# Patient Record
Sex: Male | Born: 1935 | Race: White | Hispanic: No | Marital: Single
Health system: Southern US, Community
[De-identification: ages and names within clinical notes are randomized; demographics above are authoritative.]

---

## 2015-11-17 DIAGNOSIS — J0100 Acute maxillary sinusitis, unspecified: Secondary | ICD-10-CM | POA: Diagnosis not present

## 2015-12-06 DIAGNOSIS — E162 Hypoglycemia, unspecified: Secondary | ICD-10-CM | POA: Diagnosis not present

## 2015-12-06 DIAGNOSIS — J209 Acute bronchitis, unspecified: Secondary | ICD-10-CM | POA: Diagnosis not present

## 2015-12-06 DIAGNOSIS — I10 Essential (primary) hypertension: Secondary | ICD-10-CM | POA: Diagnosis not present

## 2015-12-16 DIAGNOSIS — E039 Hypothyroidism, unspecified: Secondary | ICD-10-CM | POA: Diagnosis not present

## 2015-12-16 DIAGNOSIS — I10 Essential (primary) hypertension: Secondary | ICD-10-CM | POA: Diagnosis not present

## 2015-12-16 DIAGNOSIS — E785 Hyperlipidemia, unspecified: Secondary | ICD-10-CM | POA: Diagnosis not present

## 2015-12-16 DIAGNOSIS — R531 Weakness: Secondary | ICD-10-CM | POA: Diagnosis not present

## 2015-12-16 DIAGNOSIS — E119 Type 2 diabetes mellitus without complications: Secondary | ICD-10-CM | POA: Diagnosis not present

## 2015-12-16 DIAGNOSIS — M1990 Unspecified osteoarthritis, unspecified site: Secondary | ICD-10-CM | POA: Diagnosis not present

## 2015-12-16 DIAGNOSIS — R5383 Other fatigue: Secondary | ICD-10-CM | POA: Diagnosis not present

## 2015-12-16 DIAGNOSIS — N400 Enlarged prostate without lower urinary tract symptoms: Secondary | ICD-10-CM | POA: Diagnosis not present

## 2016-04-18 DIAGNOSIS — S4992XA Unspecified injury of left shoulder and upper arm, initial encounter: Secondary | ICD-10-CM | POA: Diagnosis not present

## 2016-04-18 DIAGNOSIS — E039 Hypothyroidism, unspecified: Secondary | ICD-10-CM | POA: Diagnosis not present

## 2016-04-18 DIAGNOSIS — I10 Essential (primary) hypertension: Secondary | ICD-10-CM | POA: Diagnosis not present

## 2016-05-22 DIAGNOSIS — C44311 Basal cell carcinoma of skin of nose: Secondary | ICD-10-CM | POA: Diagnosis not present

## 2016-06-05 DIAGNOSIS — M25512 Pain in left shoulder: Secondary | ICD-10-CM | POA: Diagnosis not present

## 2016-06-05 DIAGNOSIS — S4992XD Unspecified injury of left shoulder and upper arm, subsequent encounter: Secondary | ICD-10-CM | POA: Diagnosis not present

## 2016-06-22 DIAGNOSIS — M21371 Foot drop, right foot: Secondary | ICD-10-CM | POA: Diagnosis not present

## 2016-06-22 DIAGNOSIS — M21372 Foot drop, left foot: Secondary | ICD-10-CM | POA: Diagnosis not present

## 2016-07-13 DIAGNOSIS — R2689 Other abnormalities of gait and mobility: Secondary | ICD-10-CM | POA: Diagnosis not present

## 2016-07-13 DIAGNOSIS — M21371 Foot drop, right foot: Secondary | ICD-10-CM | POA: Diagnosis not present

## 2016-07-13 DIAGNOSIS — M21372 Foot drop, left foot: Secondary | ICD-10-CM | POA: Diagnosis not present

## 2016-09-19 DIAGNOSIS — M21371 Foot drop, right foot: Secondary | ICD-10-CM | POA: Diagnosis not present

## 2016-09-19 DIAGNOSIS — R2689 Other abnormalities of gait and mobility: Secondary | ICD-10-CM | POA: Diagnosis not present

## 2016-09-19 DIAGNOSIS — M21372 Foot drop, left foot: Secondary | ICD-10-CM | POA: Diagnosis not present

## 2016-10-04 DIAGNOSIS — R296 Repeated falls: Secondary | ICD-10-CM | POA: Diagnosis not present

## 2016-10-04 DIAGNOSIS — R2689 Other abnormalities of gait and mobility: Secondary | ICD-10-CM | POA: Diagnosis not present

## 2016-10-04 DIAGNOSIS — M21372 Foot drop, left foot: Secondary | ICD-10-CM | POA: Diagnosis not present

## 2016-10-04 DIAGNOSIS — M21371 Foot drop, right foot: Secondary | ICD-10-CM | POA: Diagnosis not present

## 2016-10-19 DIAGNOSIS — I10 Essential (primary) hypertension: Secondary | ICD-10-CM | POA: Diagnosis not present

## 2016-10-19 DIAGNOSIS — E039 Hypothyroidism, unspecified: Secondary | ICD-10-CM | POA: Diagnosis not present

## 2016-10-19 DIAGNOSIS — R739 Hyperglycemia, unspecified: Secondary | ICD-10-CM | POA: Diagnosis not present

## 2016-10-19 DIAGNOSIS — Z131 Encounter for screening for diabetes mellitus: Secondary | ICD-10-CM | POA: Diagnosis not present

## 2016-10-19 DIAGNOSIS — E119 Type 2 diabetes mellitus without complications: Secondary | ICD-10-CM | POA: Diagnosis not present

## 2016-10-19 DIAGNOSIS — E782 Mixed hyperlipidemia: Secondary | ICD-10-CM | POA: Diagnosis not present

## 2016-11-01 DIAGNOSIS — C44329 Squamous cell carcinoma of skin of other parts of face: Secondary | ICD-10-CM | POA: Diagnosis not present

## 2016-11-01 DIAGNOSIS — L570 Actinic keratosis: Secondary | ICD-10-CM | POA: Diagnosis not present

## 2016-11-21 DIAGNOSIS — E119 Type 2 diabetes mellitus without complications: Secondary | ICD-10-CM | POA: Diagnosis not present

## 2016-12-28 DIAGNOSIS — E782 Mixed hyperlipidemia: Secondary | ICD-10-CM | POA: Diagnosis not present

## 2017-01-17 DIAGNOSIS — N183 Chronic kidney disease, stage 3 (moderate): Secondary | ICD-10-CM | POA: Diagnosis not present

## 2017-01-17 DIAGNOSIS — J0120 Acute ethmoidal sinusitis, unspecified: Secondary | ICD-10-CM | POA: Diagnosis not present

## 2017-01-17 DIAGNOSIS — E119 Type 2 diabetes mellitus without complications: Secondary | ICD-10-CM | POA: Diagnosis not present

## 2017-01-17 DIAGNOSIS — E039 Hypothyroidism, unspecified: Secondary | ICD-10-CM | POA: Diagnosis not present

## 2017-01-18 DIAGNOSIS — M21372 Foot drop, left foot: Secondary | ICD-10-CM | POA: Diagnosis not present

## 2017-01-18 DIAGNOSIS — S91311A Laceration without foreign body, right foot, initial encounter: Secondary | ICD-10-CM | POA: Diagnosis not present

## 2017-01-18 DIAGNOSIS — E119 Type 2 diabetes mellitus without complications: Secondary | ICD-10-CM | POA: Diagnosis not present

## 2017-01-18 DIAGNOSIS — M21371 Foot drop, right foot: Secondary | ICD-10-CM | POA: Diagnosis not present

## 2017-01-22 DIAGNOSIS — E039 Hypothyroidism, unspecified: Secondary | ICD-10-CM | POA: Diagnosis not present

## 2017-01-22 DIAGNOSIS — E119 Type 2 diabetes mellitus without complications: Secondary | ICD-10-CM | POA: Diagnosis not present

## 2017-01-24 DIAGNOSIS — L300 Nummular dermatitis: Secondary | ICD-10-CM | POA: Diagnosis not present

## 2017-01-24 DIAGNOSIS — R233 Spontaneous ecchymoses: Secondary | ICD-10-CM | POA: Diagnosis not present

## 2017-02-21 DIAGNOSIS — R748 Abnormal levels of other serum enzymes: Secondary | ICD-10-CM | POA: Diagnosis not present

## 2017-03-12 DIAGNOSIS — R310 Gross hematuria: Secondary | ICD-10-CM | POA: Diagnosis not present

## 2017-03-12 DIAGNOSIS — E782 Mixed hyperlipidemia: Secondary | ICD-10-CM | POA: Diagnosis not present

## 2017-03-12 DIAGNOSIS — M791 Myalgia: Secondary | ICD-10-CM | POA: Diagnosis not present

## 2017-03-13 DIAGNOSIS — R945 Abnormal results of liver function studies: Secondary | ICD-10-CM | POA: Diagnosis not present

## 2017-03-13 DIAGNOSIS — K838 Other specified diseases of biliary tract: Secondary | ICD-10-CM | POA: Diagnosis not present

## 2017-03-13 DIAGNOSIS — R748 Abnormal levels of other serum enzymes: Secondary | ICD-10-CM | POA: Diagnosis not present

## 2017-03-17 DIAGNOSIS — Z7982 Long term (current) use of aspirin: Secondary | ICD-10-CM | POA: Diagnosis not present

## 2017-03-17 DIAGNOSIS — I129 Hypertensive chronic kidney disease with stage 1 through stage 4 chronic kidney disease, or unspecified chronic kidney disease: Secondary | ICD-10-CM | POA: Diagnosis not present

## 2017-03-17 DIAGNOSIS — R195 Other fecal abnormalities: Secondary | ICD-10-CM | POA: Diagnosis not present

## 2017-03-17 DIAGNOSIS — R17 Unspecified jaundice: Secondary | ICD-10-CM | POA: Diagnosis not present

## 2017-03-17 DIAGNOSIS — R7989 Other specified abnormal findings of blood chemistry: Secondary | ICD-10-CM | POA: Diagnosis not present

## 2017-03-17 DIAGNOSIS — K820 Obstruction of gallbladder: Secondary | ICD-10-CM | POA: Diagnosis not present

## 2017-03-17 DIAGNOSIS — E860 Dehydration: Secondary | ICD-10-CM | POA: Diagnosis not present

## 2017-03-17 DIAGNOSIS — K922 Gastrointestinal hemorrhage, unspecified: Secondary | ICD-10-CM | POA: Diagnosis not present

## 2017-03-17 DIAGNOSIS — Z87891 Personal history of nicotine dependence: Secondary | ICD-10-CM | POA: Diagnosis not present

## 2017-03-17 DIAGNOSIS — R531 Weakness: Secondary | ICD-10-CM | POA: Diagnosis not present

## 2017-03-17 DIAGNOSIS — H9190 Unspecified hearing loss, unspecified ear: Secondary | ICD-10-CM | POA: Diagnosis not present

## 2017-03-17 DIAGNOSIS — R634 Abnormal weight loss: Secondary | ICD-10-CM | POA: Diagnosis not present

## 2017-03-17 DIAGNOSIS — E875 Hyperkalemia: Secondary | ICD-10-CM | POA: Diagnosis not present

## 2017-03-17 DIAGNOSIS — D500 Iron deficiency anemia secondary to blood loss (chronic): Secondary | ICD-10-CM | POA: Diagnosis not present

## 2017-03-17 DIAGNOSIS — K8689 Other specified diseases of pancreas: Secondary | ICD-10-CM | POA: Diagnosis not present

## 2017-03-17 DIAGNOSIS — I10 Essential (primary) hypertension: Secondary | ICD-10-CM | POA: Diagnosis not present

## 2017-03-17 DIAGNOSIS — E039 Hypothyroidism, unspecified: Secondary | ICD-10-CM | POA: Diagnosis not present

## 2017-03-17 DIAGNOSIS — N183 Chronic kidney disease, stage 3 (moderate): Secondary | ICD-10-CM | POA: Diagnosis not present

## 2017-03-17 DIAGNOSIS — K921 Melena: Secondary | ICD-10-CM | POA: Diagnosis not present

## 2017-03-17 DIAGNOSIS — I429 Cardiomyopathy, unspecified: Secondary | ICD-10-CM | POA: Diagnosis not present

## 2017-03-17 DIAGNOSIS — N179 Acute kidney failure, unspecified: Secondary | ICD-10-CM | POA: Diagnosis not present

## 2017-03-17 DIAGNOSIS — K831 Obstruction of bile duct: Secondary | ICD-10-CM | POA: Diagnosis not present

## 2017-03-17 DIAGNOSIS — Z79899 Other long term (current) drug therapy: Secondary | ICD-10-CM | POA: Diagnosis not present

## 2017-03-17 DIAGNOSIS — R945 Abnormal results of liver function studies: Secondary | ICD-10-CM | POA: Diagnosis not present

## 2017-03-18 DIAGNOSIS — K831 Obstruction of bile duct: Secondary | ICD-10-CM | POA: Diagnosis not present

## 2017-03-18 DIAGNOSIS — R933 Abnormal findings on diagnostic imaging of other parts of digestive tract: Secondary | ICD-10-CM | POA: Diagnosis not present

## 2017-03-18 DIAGNOSIS — R945 Abnormal results of liver function studies: Secondary | ICD-10-CM | POA: Diagnosis not present

## 2017-03-18 DIAGNOSIS — K869 Disease of pancreas, unspecified: Secondary | ICD-10-CM | POA: Diagnosis not present

## 2017-03-19 DIAGNOSIS — K869 Disease of pancreas, unspecified: Secondary | ICD-10-CM | POA: Diagnosis not present

## 2017-03-19 DIAGNOSIS — K831 Obstruction of bile duct: Secondary | ICD-10-CM | POA: Diagnosis not present

## 2017-03-20 DIAGNOSIS — K831 Obstruction of bile duct: Secondary | ICD-10-CM | POA: Diagnosis not present

## 2017-03-20 DIAGNOSIS — D500 Iron deficiency anemia secondary to blood loss (chronic): Secondary | ICD-10-CM | POA: Diagnosis not present

## 2017-03-20 DIAGNOSIS — K921 Melena: Secondary | ICD-10-CM | POA: Diagnosis not present

## 2017-03-20 DIAGNOSIS — R933 Abnormal findings on diagnostic imaging of other parts of digestive tract: Secondary | ICD-10-CM | POA: Diagnosis not present

## 2017-03-20 DIAGNOSIS — K869 Disease of pancreas, unspecified: Secondary | ICD-10-CM | POA: Diagnosis not present

## 2017-03-20 DIAGNOSIS — R945 Abnormal results of liver function studies: Secondary | ICD-10-CM | POA: Diagnosis not present

## 2017-03-20 DIAGNOSIS — R195 Other fecal abnormalities: Secondary | ICD-10-CM | POA: Diagnosis not present

## 2017-03-21 DIAGNOSIS — R740 Nonspecific elevation of levels of transaminase and lactic acid dehydrogenase [LDH]: Secondary | ICD-10-CM | POA: Diagnosis not present

## 2017-03-21 DIAGNOSIS — K922 Gastrointestinal hemorrhage, unspecified: Secondary | ICD-10-CM | POA: Diagnosis not present

## 2017-03-21 DIAGNOSIS — I10 Essential (primary) hypertension: Secondary | ICD-10-CM | POA: Diagnosis not present

## 2017-03-21 DIAGNOSIS — R262 Difficulty in walking, not elsewhere classified: Secondary | ICD-10-CM | POA: Diagnosis not present

## 2017-03-21 DIAGNOSIS — B252 Cytomegaloviral pancreatitis: Secondary | ICD-10-CM | POA: Diagnosis not present

## 2017-03-21 DIAGNOSIS — E038 Other specified hypothyroidism: Secondary | ICD-10-CM | POA: Diagnosis not present

## 2017-03-21 DIAGNOSIS — R489 Unspecified symbolic dysfunctions: Secondary | ICD-10-CM | POA: Diagnosis not present

## 2017-03-21 DIAGNOSIS — K869 Disease of pancreas, unspecified: Secondary | ICD-10-CM | POA: Diagnosis not present

## 2017-03-21 DIAGNOSIS — M6281 Muscle weakness (generalized): Secondary | ICD-10-CM | POA: Diagnosis not present

## 2017-03-21 DIAGNOSIS — Z9181 History of falling: Secondary | ICD-10-CM | POA: Diagnosis not present

## 2017-03-21 DIAGNOSIS — R1084 Generalized abdominal pain: Secondary | ICD-10-CM | POA: Diagnosis not present

## 2017-03-21 DIAGNOSIS — R17 Unspecified jaundice: Secondary | ICD-10-CM | POA: Diagnosis not present

## 2017-03-21 DIAGNOSIS — I129 Hypertensive chronic kidney disease with stage 1 through stage 4 chronic kidney disease, or unspecified chronic kidney disease: Secondary | ICD-10-CM | POA: Diagnosis not present

## 2017-03-22 DIAGNOSIS — E039 Hypothyroidism, unspecified: Secondary | ICD-10-CM | POA: Diagnosis not present

## 2017-03-22 DIAGNOSIS — K869 Disease of pancreas, unspecified: Secondary | ICD-10-CM | POA: Diagnosis not present

## 2017-03-22 DIAGNOSIS — I428 Other cardiomyopathies: Secondary | ICD-10-CM | POA: Diagnosis not present

## 2017-03-22 DIAGNOSIS — I10 Essential (primary) hypertension: Secondary | ICD-10-CM | POA: Diagnosis not present

## 2017-03-24 DIAGNOSIS — K921 Melena: Secondary | ICD-10-CM | POA: Diagnosis not present

## 2017-03-24 DIAGNOSIS — I428 Other cardiomyopathies: Secondary | ICD-10-CM | POA: Diagnosis not present

## 2017-03-24 DIAGNOSIS — E039 Hypothyroidism, unspecified: Secondary | ICD-10-CM | POA: Diagnosis not present

## 2017-03-24 DIAGNOSIS — I10 Essential (primary) hypertension: Secondary | ICD-10-CM | POA: Diagnosis not present

## 2017-03-28 ENCOUNTER — Other Ambulatory Visit: Payer: Self-pay | Admitting: *Deleted

## 2017-03-28 NOTE — Patient Outreach (Signed)
[facility]) Care Management  03/28/2017  Chad Collins 17-Jul-1933 [id number]   Met with Chad Quam, LPN, Case manager at facility. She reports patient has a new diagnosis of pancreatic cancer. She reports patient and family are in denial of diagnosis. She reports that patient wanted to leave AMA yesterday but she was able to speak with patient and family regarding discharge and they have agreed to stay until 03/29/17. She will set up home care. RNCM discussed home care agencies with contracts with [facility]. Explained that patient is eligible for [facility] care management services.   Met with patient wife at bedside of facility. She states patient going home tomorrow. She states he has some tests to find out if he has cancer. She states that if he does he may come back to the facility for care.  RNCM reviewed [facility] community care management program. Gave patient wife a brochure and explained for her to contact RNCM or [facility] care management office for future needs. She agrees.   Plan to sign . Patient will get EMMI discharge calls. Family has [facility] and RNCM contact for future needs.  Chad Collins. Chad Purser, RN, BSN, [place] [phone]) Business Cell  [phone]) Toll Free Office

## 2017-03-29 DIAGNOSIS — E039 Hypothyroidism, unspecified: Secondary | ICD-10-CM | POA: Diagnosis not present

## 2017-03-29 DIAGNOSIS — I428 Other cardiomyopathies: Secondary | ICD-10-CM | POA: Diagnosis not present

## 2017-03-29 DIAGNOSIS — I10 Essential (primary) hypertension: Secondary | ICD-10-CM | POA: Diagnosis not present

## 2017-03-29 DIAGNOSIS — K869 Disease of pancreas, unspecified: Secondary | ICD-10-CM | POA: Diagnosis not present

## 2017-03-30 DIAGNOSIS — C250 Malignant neoplasm of head of pancreas: Secondary | ICD-10-CM | POA: Diagnosis not present

## 2017-03-30 DIAGNOSIS — E119 Type 2 diabetes mellitus without complications: Secondary | ICD-10-CM | POA: Diagnosis not present

## 2017-03-30 DIAGNOSIS — K869 Disease of pancreas, unspecified: Secondary | ICD-10-CM | POA: Diagnosis not present

## 2017-03-30 DIAGNOSIS — R17 Unspecified jaundice: Secondary | ICD-10-CM | POA: Diagnosis not present

## 2017-03-30 DIAGNOSIS — Z7982 Long term (current) use of aspirin: Secondary | ICD-10-CM | POA: Diagnosis not present

## 2017-03-30 DIAGNOSIS — I351 Nonrheumatic aortic (valve) insufficiency: Secondary | ICD-10-CM | POA: Diagnosis not present

## 2017-03-30 DIAGNOSIS — E039 Hypothyroidism, unspecified: Secondary | ICD-10-CM | POA: Diagnosis not present

## 2017-03-30 DIAGNOSIS — K862 Cyst of pancreas: Secondary | ICD-10-CM | POA: Diagnosis not present

## 2017-03-30 DIAGNOSIS — N183 Chronic kidney disease, stage 3 (moderate): Secondary | ICD-10-CM | POA: Diagnosis not present

## 2017-03-30 DIAGNOSIS — K831 Obstruction of bile duct: Secondary | ICD-10-CM | POA: Diagnosis not present

## 2017-03-30 DIAGNOSIS — I129 Hypertensive chronic kidney disease with stage 1 through stage 4 chronic kidney disease, or unspecified chronic kidney disease: Secondary | ICD-10-CM | POA: Diagnosis not present

## 2017-03-31 DIAGNOSIS — I129 Hypertensive chronic kidney disease with stage 1 through stage 4 chronic kidney disease, or unspecified chronic kidney disease: Secondary | ICD-10-CM | POA: Diagnosis not present

## 2017-03-31 DIAGNOSIS — Z9689 Presence of other specified functional implants: Secondary | ICD-10-CM | POA: Diagnosis not present

## 2017-03-31 DIAGNOSIS — N183 Chronic kidney disease, stage 3 (moderate): Secondary | ICD-10-CM | POA: Diagnosis not present

## 2017-03-31 DIAGNOSIS — Z87891 Personal history of nicotine dependence: Secondary | ICD-10-CM | POA: Diagnosis not present

## 2017-03-31 DIAGNOSIS — I429 Cardiomyopathy, unspecified: Secondary | ICD-10-CM | POA: Diagnosis not present

## 2017-03-31 DIAGNOSIS — R32 Unspecified urinary incontinence: Secondary | ICD-10-CM | POA: Diagnosis not present

## 2017-03-31 DIAGNOSIS — Z9181 History of falling: Secondary | ICD-10-CM | POA: Diagnosis not present

## 2017-03-31 DIAGNOSIS — D490 Neoplasm of unspecified behavior of digestive system: Secondary | ICD-10-CM | POA: Diagnosis not present

## 2017-04-05 DIAGNOSIS — R188 Other ascites: Secondary | ICD-10-CM | POA: Diagnosis not present

## 2017-04-10 DIAGNOSIS — K831 Obstruction of bile duct: Secondary | ICD-10-CM | POA: Diagnosis not present

## 2017-04-10 DIAGNOSIS — C250 Malignant neoplasm of head of pancreas: Secondary | ICD-10-CM | POA: Diagnosis not present

## 2017-04-10 DIAGNOSIS — K869 Disease of pancreas, unspecified: Secondary | ICD-10-CM | POA: Diagnosis not present

## 2017-04-12 DIAGNOSIS — C250 Malignant neoplasm of head of pancreas: Secondary | ICD-10-CM | POA: Diagnosis not present

## 2017-04-16 DIAGNOSIS — C250 Malignant neoplasm of head of pancreas: Secondary | ICD-10-CM | POA: Diagnosis not present

## 2017-04-16 DIAGNOSIS — K869 Disease of pancreas, unspecified: Secondary | ICD-10-CM | POA: Diagnosis not present

## 2017-04-26 DIAGNOSIS — E039 Hypothyroidism, unspecified: Secondary | ICD-10-CM | POA: Diagnosis not present

## 2017-04-26 DIAGNOSIS — E119 Type 2 diabetes mellitus without complications: Secondary | ICD-10-CM | POA: Diagnosis not present

## 2017-04-26 DIAGNOSIS — I10 Essential (primary) hypertension: Secondary | ICD-10-CM | POA: Diagnosis not present

## 2017-04-26 DIAGNOSIS — E782 Mixed hyperlipidemia: Secondary | ICD-10-CM | POA: Diagnosis not present

## 2017-05-31 DIAGNOSIS — I351 Nonrheumatic aortic (valve) insufficiency: Secondary | ICD-10-CM | POA: Diagnosis not present

## 2017-05-31 DIAGNOSIS — E119 Type 2 diabetes mellitus without complications: Secondary | ICD-10-CM | POA: Diagnosis not present

## 2017-05-31 DIAGNOSIS — K869 Disease of pancreas, unspecified: Secondary | ICD-10-CM | POA: Diagnosis not present

## 2017-05-31 DIAGNOSIS — Z9889 Other specified postprocedural states: Secondary | ICD-10-CM | POA: Diagnosis not present

## 2017-05-31 DIAGNOSIS — E039 Hypothyroidism, unspecified: Secondary | ICD-10-CM | POA: Diagnosis not present

## 2017-05-31 DIAGNOSIS — K831 Obstruction of bile duct: Secondary | ICD-10-CM | POA: Diagnosis not present

## 2017-05-31 DIAGNOSIS — Z87891 Personal history of nicotine dependence: Secondary | ICD-10-CM | POA: Diagnosis not present

## 2017-05-31 DIAGNOSIS — E785 Hyperlipidemia, unspecified: Secondary | ICD-10-CM | POA: Diagnosis not present

## 2017-05-31 DIAGNOSIS — K219 Gastro-esophageal reflux disease without esophagitis: Secondary | ICD-10-CM | POA: Diagnosis not present

## 2017-05-31 DIAGNOSIS — C250 Malignant neoplasm of head of pancreas: Secondary | ICD-10-CM | POA: Diagnosis not present

## 2017-08-30 DIAGNOSIS — G893 Neoplasm related pain (acute) (chronic): Secondary | ICD-10-CM | POA: Diagnosis not present

## 2017-08-30 DIAGNOSIS — I10 Essential (primary) hypertension: Secondary | ICD-10-CM | POA: Diagnosis not present

## 2017-08-30 DIAGNOSIS — C250 Malignant neoplasm of head of pancreas: Secondary | ICD-10-CM | POA: Diagnosis not present

## 2017-09-10 IMAGING — DX DG CHEST 2V
2 series · 2 of 2 positions shown · non-contrast
Comparison: None.

CLINICAL DATA: Left side chest pain.  Bilateral hand numbness.

EXAM:
CHEST  2 VIEW

[chest pa]
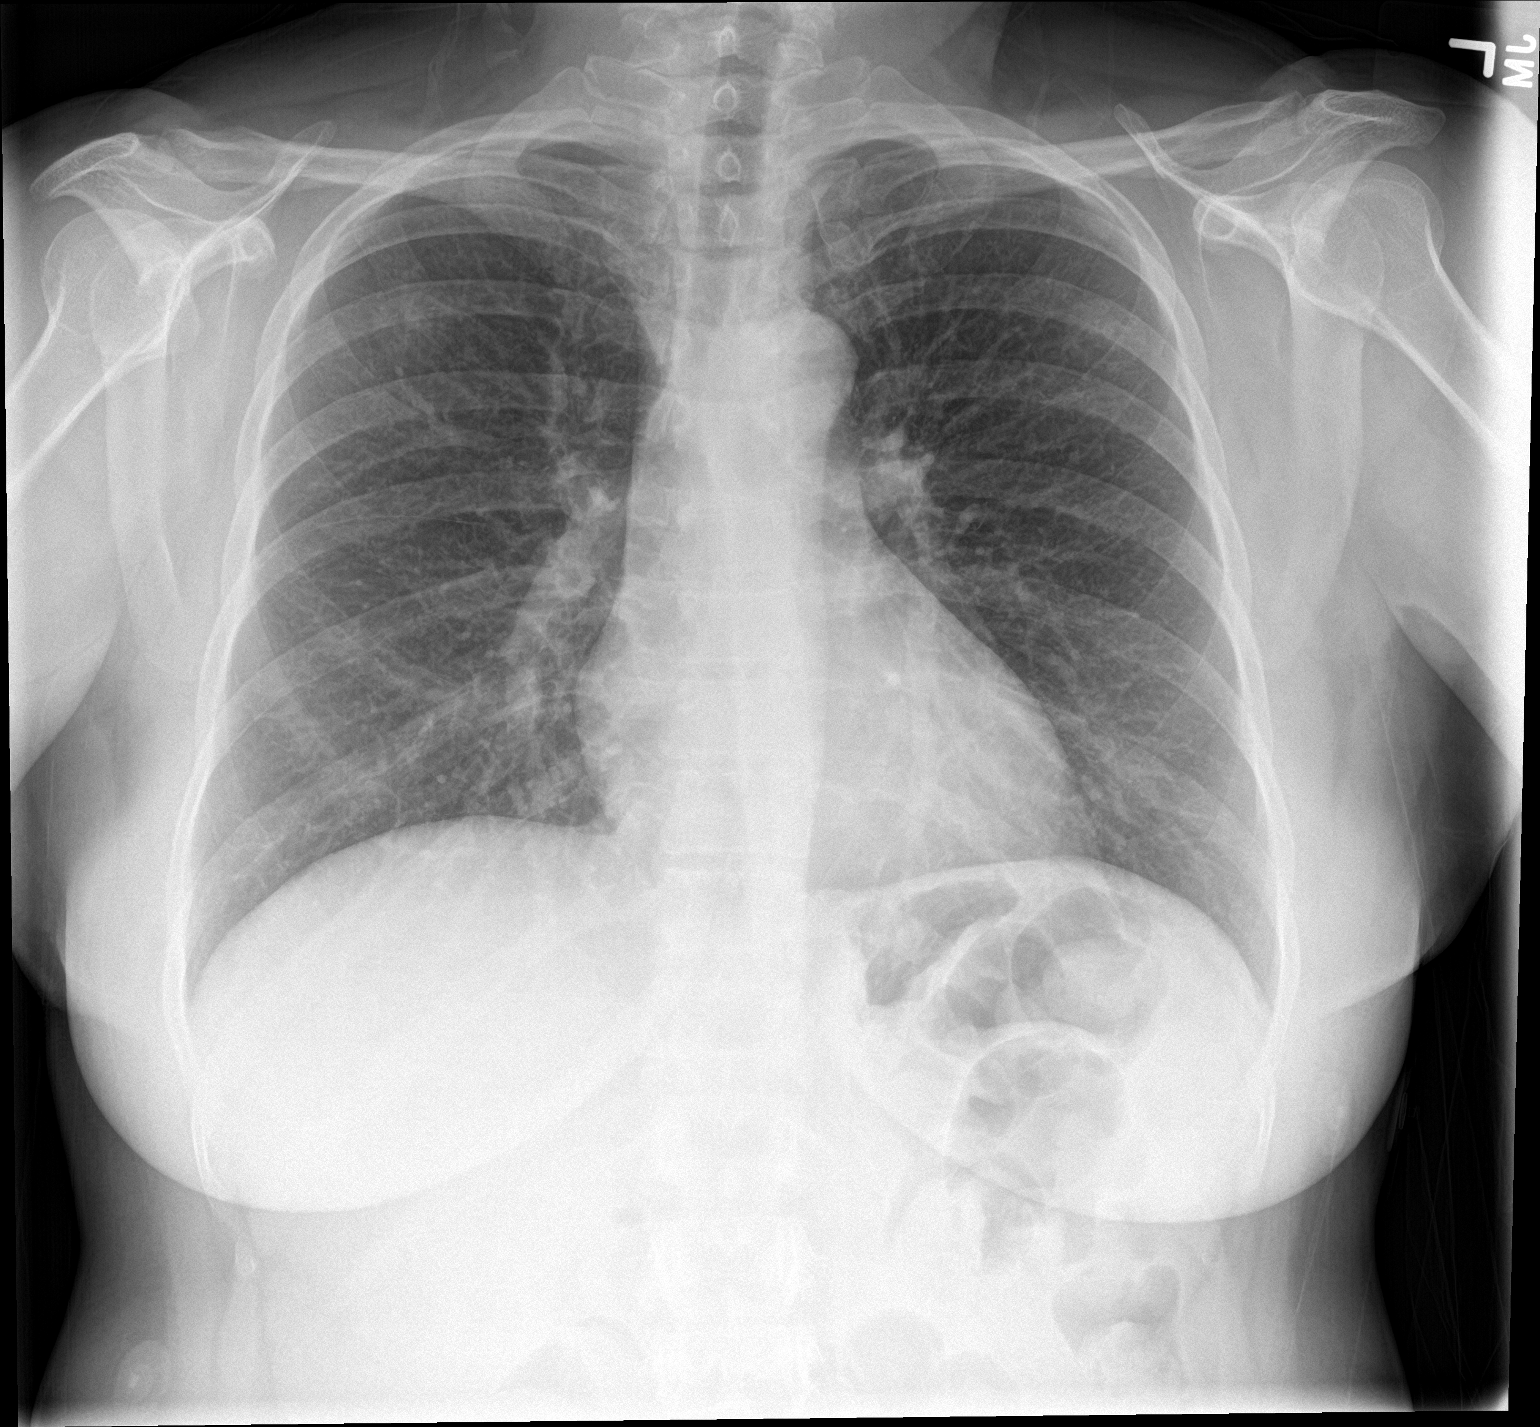

[chest lat]
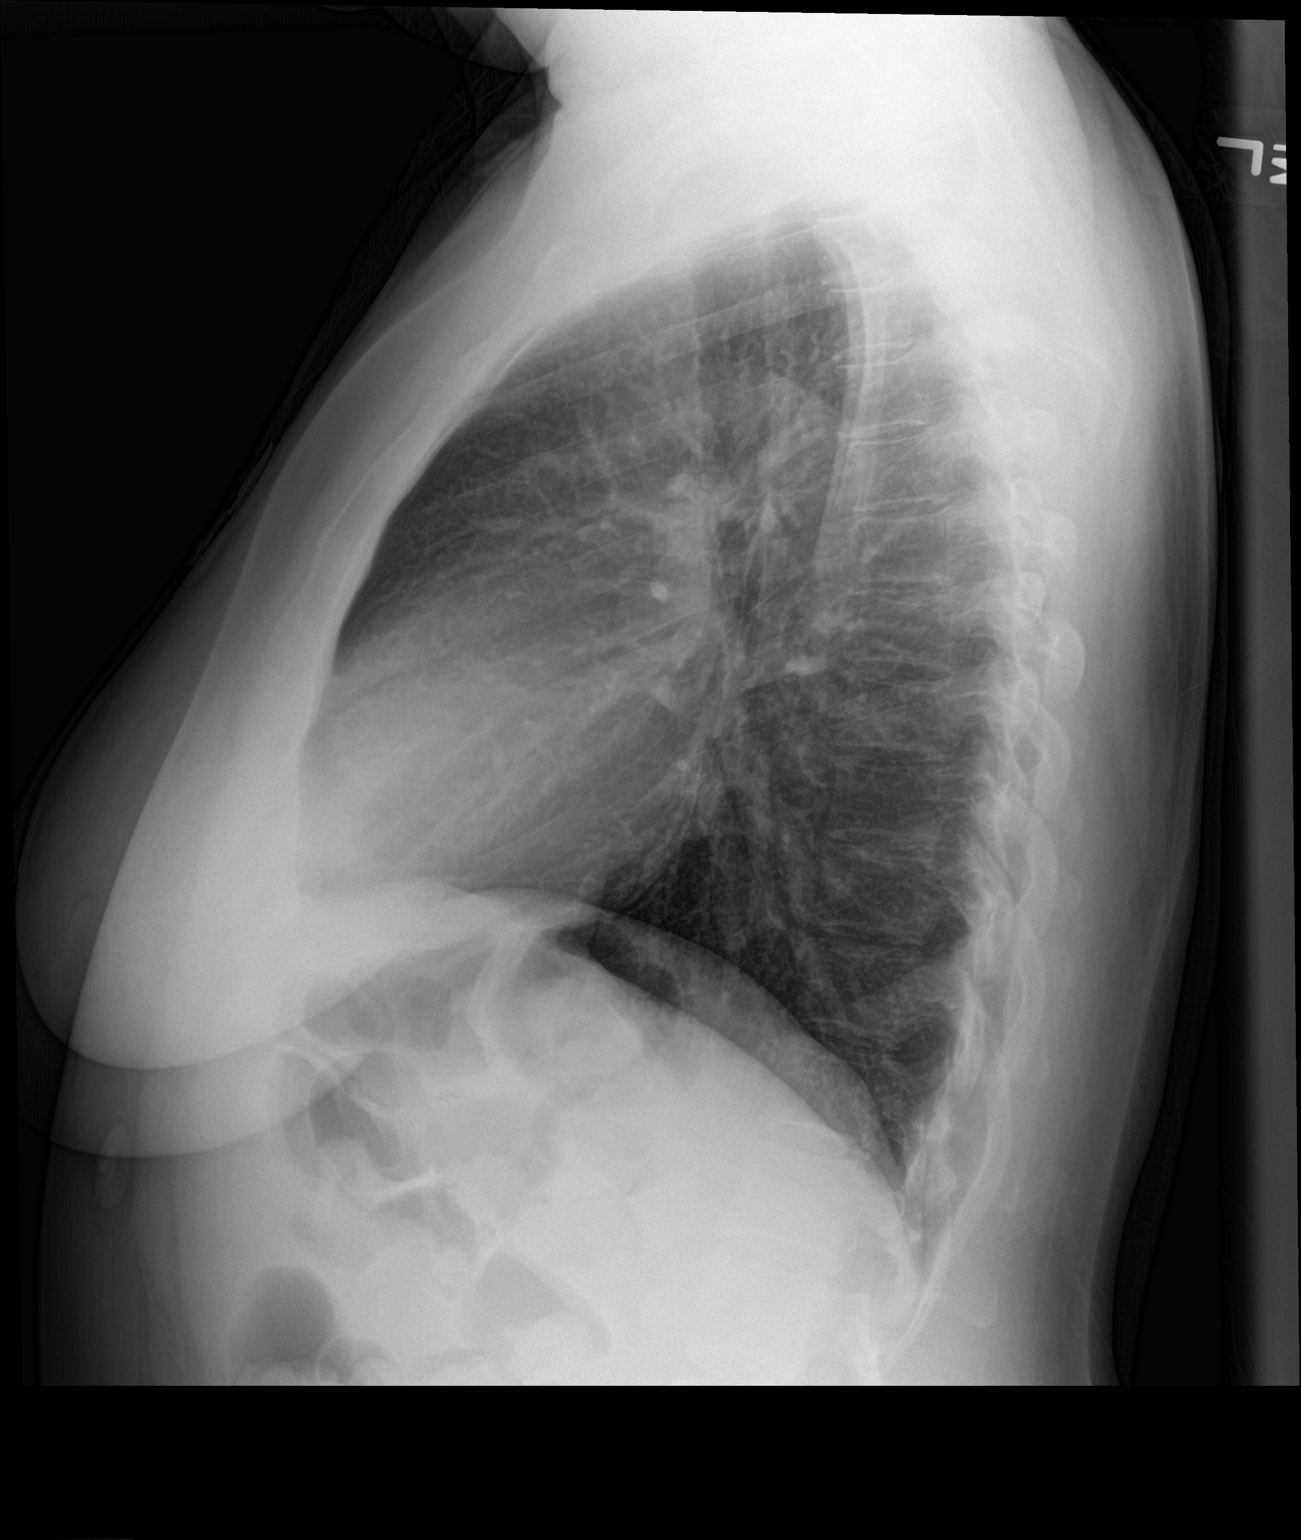

[2 of 2 positions shown; findings below may reference images not displayed]

FINDINGS: Heart and mediastinal contours are within normal limits. No focal
opacities or effusions. No acute bony abnormality.
IMPRESSION: No active cardiopulmonary disease.

## 2017-09-28 DIAGNOSIS — N179 Acute kidney failure, unspecified: Secondary | ICD-10-CM | POA: Diagnosis not present

## 2017-09-28 DIAGNOSIS — Z87891 Personal history of nicotine dependence: Secondary | ICD-10-CM | POA: Diagnosis not present

## 2017-09-28 DIAGNOSIS — S3991XA Unspecified injury of abdomen, initial encounter: Secondary | ICD-10-CM | POA: Diagnosis not present

## 2017-09-28 DIAGNOSIS — Z79899 Other long term (current) drug therapy: Secondary | ICD-10-CM | POA: Diagnosis not present

## 2017-09-28 DIAGNOSIS — C787 Secondary malignant neoplasm of liver and intrahepatic bile duct: Secondary | ICD-10-CM | POA: Diagnosis not present

## 2017-09-28 DIAGNOSIS — C259 Malignant neoplasm of pancreas, unspecified: Secondary | ICD-10-CM | POA: Diagnosis not present

## 2017-09-28 DIAGNOSIS — I10 Essential (primary) hypertension: Secondary | ICD-10-CM | POA: Diagnosis not present

## 2017-09-28 DIAGNOSIS — R748 Abnormal levels of other serum enzymes: Secondary | ICD-10-CM | POA: Diagnosis not present

## 2017-09-28 DIAGNOSIS — I6522 Occlusion and stenosis of left carotid artery: Secondary | ICD-10-CM | POA: Diagnosis not present

## 2017-09-28 DIAGNOSIS — G9341 Metabolic encephalopathy: Secondary | ICD-10-CM | POA: Diagnosis not present

## 2017-09-28 DIAGNOSIS — S199XXA Unspecified injury of neck, initial encounter: Secondary | ICD-10-CM | POA: Diagnosis not present

## 2017-09-28 DIAGNOSIS — R2681 Unsteadiness on feet: Secondary | ICD-10-CM | POA: Diagnosis not present

## 2017-09-28 DIAGNOSIS — S0990XA Unspecified injury of head, initial encounter: Secondary | ICD-10-CM | POA: Diagnosis not present

## 2017-09-28 DIAGNOSIS — I6789 Other cerebrovascular disease: Secondary | ICD-10-CM | POA: Diagnosis not present

## 2017-09-28 DIAGNOSIS — R410 Disorientation, unspecified: Secondary | ICD-10-CM | POA: Diagnosis not present

## 2017-09-28 DIAGNOSIS — R29704 NIHSS score 4: Secondary | ICD-10-CM | POA: Diagnosis not present

## 2017-09-28 DIAGNOSIS — G464 Cerebellar stroke syndrome: Secondary | ICD-10-CM | POA: Diagnosis not present

## 2017-09-28 DIAGNOSIS — R531 Weakness: Secondary | ICD-10-CM | POA: Diagnosis not present

## 2017-09-28 DIAGNOSIS — Z66 Do not resuscitate: Secondary | ICD-10-CM | POA: Diagnosis not present

## 2017-09-28 DIAGNOSIS — I639 Cerebral infarction, unspecified: Secondary | ICD-10-CM | POA: Diagnosis not present

## 2017-09-28 DIAGNOSIS — R109 Unspecified abdominal pain: Secondary | ICD-10-CM | POA: Diagnosis not present

## 2017-09-28 DIAGNOSIS — R7989 Other specified abnormal findings of blood chemistry: Secondary | ICD-10-CM | POA: Diagnosis not present

## 2017-09-28 DIAGNOSIS — R740 Nonspecific elevation of levels of transaminase and lactic acid dehydrogenase [LDH]: Secondary | ICD-10-CM | POA: Diagnosis not present

## 2017-09-28 DIAGNOSIS — Z515 Encounter for palliative care: Secondary | ICD-10-CM | POA: Diagnosis not present

## 2017-09-28 DIAGNOSIS — E860 Dehydration: Secondary | ICD-10-CM | POA: Diagnosis not present

## 2017-10-06 DEATH — deceased
